# Patient Record
Sex: Female | Born: 2000 | Race: White | Hispanic: No | Marital: Single | Smoking: Never smoker
Health system: Southern US, Community
[De-identification: ages and names within clinical notes are randomized; demographics above are authoritative.]

## PROBLEM LIST (undated history)

## (undated) DIAGNOSIS — L709 Acne, unspecified: Secondary | ICD-10-CM

---
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
## 2023-08-03 ENCOUNTER — Ambulatory Visit: Payer: Medicaid Other | Admitting: Dermatology

## 2023-08-03 ENCOUNTER — Encounter: Payer: Self-pay | Admitting: Dermatology

## 2023-08-03 VITALS — BP 125/79

## 2023-08-03 DIAGNOSIS — L700 Acne vulgaris: Secondary | ICD-10-CM | POA: Diagnosis not present

## 2023-08-03 DIAGNOSIS — L905 Scar conditions and fibrosis of skin: Secondary | ICD-10-CM

## 2023-08-03 MED ORDER — SPIRONOLACTONE 100 MG PO TABS: 100.0000 mg | ORAL_TABLET | Freq: Every day | ORAL | 5 refills | Status: AC

## 2023-08-03 MED ORDER — TRETINOIN 0.025 % EX CREA: TOPICAL_CREAM | Freq: Every day | CUTANEOUS | 5 refills | Status: AC

## 2023-08-03 MED ORDER — CLINDAMYCIN PHOSPHATE 1 % EX LOTN: TOPICAL_LOTION | Freq: Every morning | CUTANEOUS | 5 refills | Status: AC

## 2023-08-03 NOTE — Progress Notes (Signed)
   New Patient Visit   Subjective  Christine Reeves is a 23 y.o. female who presents for the following: Acne of face, chest and back. She started using Head and shoulders shampoo on her back and it has improved. She took Accutane when she was in middle school. The last time she had any medication for acne she was in high school.She does have some scarring.   The following portions of the chart were reviewed this encounter and updated as appropriate: medications, allergies, medical history  Review of Systems:  No other skin or systemic complaints except as noted in HPI or Assessment and Plan.  Objective  Well appearing patient in no apparent distress; mood and affect are within normal limits.   A focused examination was performed of the following areas: Face, chest, back  Relevant exam findings are noted in the Assessment and Plan.    Assessment & Plan            Acne Vulgaris and Acne Scarring - Assessment: 23 year old female with acne on the jawline, chest, and back. Improvement noted with Head and Shoulders shampoo, attributing to zinc's anti-inflammatory properties. Hormonal factors suspected due to the patient's age and acne distribution. Previous treatments include Panoxyl 10% wash. - Plan:   - Initiate spironolactone  for hormonal acne management (dose not specified).   - Topical regimen:     - AM: Use Panoxyl 4-5% wash, apply The Ordinary niacinamide zinc serum, clindamycin  lotion, and The Ordinary azelaic acid moisturizer.     - PM: Use a gentle cleanser (The Ordinary or La Roche-Posay), apply zinc serum, tretinoin  0.025% (2-3 nights/week), and Vanicream moisturizer.   - For back acne: Mix CeraVe or Cetaphil lotion with a pea-sized amount of tretinoin .   - Patient education on the proper application of tretinoin  and the importance of thoroughly rinsing conditioner from the back.   - Discontinue tretinoin  and spironolactone  if pregnancy occurs.   - Follow-up in June to  assess progress and consider increasing tretinoin  strength.   - Expect a 70-80% improvement in 3 months with incremental improvements every 4 weeks.       Return in about 6 months (around 01/31/2024) for Acne.  I, Roseline Hutchinson, CMA, am acting as scribe for [employer], DO .   Documentation: I have reviewed the above documentation for accuracy and completeness, and I agree with the above.  Delon Lenis, DO

## 2023-08-03 NOTE — Patient Instructions (Addendum)
 Hello Christine Reeves,  Thank you for visiting my office today. Your dedication to enhancing your skin health is greatly appreciated. Here is a summary of the key instructions from today's consultation:  - **Medications:**   - **Spironolactone :** Take as prescribed to assist with hormonal acne.   - **Clindamycin  Lotion:** Apply every morning to your face and upper back.   - **Tretinoin  0.025%:** Begin using at night, two to three nights a week. We plan to increase the dosage in June.   - **Panoxyl:** Reduce strength to 4 or 5 percent and use in the mornings.  - **Skincare Routine:**   - **Morning:** Start with Panoxyl for washing, follow with The Ordinary Niacinamide Zinc Serum, then Clindamycin  Lotion, and conclude with The Ordinary Azelaic Acid Moisturizer.   - **Night:** Cleanse with The Ordinary Gentle Cleanser or La Roche-Posay Cleanser, apply the zinc serum, a pea-sized amount of Tretinoin  (2-3 nights per week), and finish with Vanicream.  - **Additional Tips:**   - Be sure to thoroughly rinse off conditioner to avoid back acne.   - Avoid exfoliating, as Tretinoin  will increase skin cell turnover on its own.  - **Follow-Up:**   - We will schedule a follow-up appointment in June to evaluate your progress and possibly adjust the Tretinoin  dosage.  Please adhere to these instructions carefully and reach out to the office with any questions or concerns you may have. I am looking forward to observing the improvements at our next meeting.  Warm regards,  Dr. Delon Lenis D[facility]at[facility]        Doxycycline should be taken with food to prevent nausea. Do not lay down for 30 minutes after taking. Be cautious with sun exposure and use good sun protection while on this medication. Pregnant women should not take this medication.    Spironolactone  can cause increased urination and cause blood pressure to decrease. Please watch for signs of lightheadedness and be cautious when changing position. It  can sometimes cause breast tenderness or an irregular period in premenopausal women. It can also increase potassium. The increase in potassium usually is not a concern unless you are taking other medicines that also increase potassium, so please be sure your doctor knows all of the other medications you are taking. This medication should not be taken by pregnant women.  This medicine should also not be taken together with sulfa drugs like Bactrim (trimethoprim/sulfamethexazole).     Important Information  Due to recent changes in healthcare laws, you may see results of your pathology and/or laboratory studies on MyChart before the doctors have had a chance to review them. We understand that in some cases there may be results that are confusing or concerning to you. Please understand that not all results are received at the same time and often the doctors may need to interpret multiple results in order to provide you with the best plan of care or course of treatment. Therefore, we ask that you please give us  2 business days to thoroughly review all your results before contacting the office for clarification. Should we see a critical lab result, you will be contacted sooner.   If You Need Anything After Your Visit  If you have any questions or concerns for your doctor, please call our main line at [phone] If no one answers, please leave a voicemail as directed and we will return your call as soon as possible. Messages left after 4 pm will be answered the following business day.   You may also send us  a  message via MyChart. We typically respond to MyChart messages within 1-2 business days.  For prescription refills, please ask your pharmacy to contact our office. Our fax number is [phone].  If you have an urgent issue when the clinic is closed that cannot wait until the next business day, you can page your doctor at the number below.    Please note that while we do our best to be available  for urgent issues outside of office hours, we are not available 24/7.   If you have an urgent issue and are unable to reach us , you may choose to seek medical care at your doctor's office, retail clinic, urgent care center, or emergency room.  If you have a medical emergency, please immediately call 911 or go to the emergency department. In the event of inclement weather, please call our main line at [phone] for an update on the status of any delays or closures.  Dermatology Medication Tips: Please keep the boxes that topical medications come in in order to help keep track of the instructions about where and how to use these. Pharmacies typically print the medication instructions only on the boxes and not directly on the medication tubes.   If your medication is too expensive, please contact our office at [phone] or send us  a message through MyChart.   We are unable to tell what your co-pay for medications will be in advance as this is different depending on your insurance coverage. However, we may be able to find a substitute medication at lower cost or fill out paperwork to get insurance to cover a needed medication.   If a prior authorization is required to get your medication covered by your insurance company, please allow us  1-2 business days to complete this process.  Drug prices often vary depending on where the prescription is filled and some pharmacies may offer cheaper prices.  The website www.goodrx.com contains coupons for medications through different pharmacies. The prices here do not account for what the cost may be with help from insurance (it may be cheaper with your insurance), but the website can give you the price if you did not use any insurance.  - You can print the associated coupon and take it with your prescription to the pharmacy.  - You may also stop by our office during regular business hours and pick up a GoodRx coupon card.  - If you need your prescription  sent electronically to a different pharmacy, notify our office through [facility] or by phone at [phone]

## 2024-02-04 ENCOUNTER — Ambulatory Visit: Payer: Medicaid Other | Admitting: Dermatology

## 2024-02-17 ENCOUNTER — Ambulatory Visit: Admitting: Dermatology
